# Patient Record
Sex: Female | Born: 1980 | Race: White | Hispanic: No | Marital: Single | Smoking: Never smoker
Health system: Southern US, Community
[De-identification: ages and names within clinical notes are randomized; demographics above are authoritative.]

## PROBLEM LIST (undated history)

## (undated) DIAGNOSIS — K5792 Diverticulitis of intestine, part unspecified, without perforation or abscess without bleeding: Secondary | ICD-10-CM

---

## 2013-07-25 ENCOUNTER — Emergency Department (HOSPITAL_BASED_OUTPATIENT_CLINIC_OR_DEPARTMENT_OTHER): Payer: Self-pay

## 2013-07-25 ENCOUNTER — Encounter (HOSPITAL_BASED_OUTPATIENT_CLINIC_OR_DEPARTMENT_OTHER): Payer: Self-pay | Admitting: Emergency Medicine

## 2013-07-25 ENCOUNTER — Emergency Department (HOSPITAL_BASED_OUTPATIENT_CLINIC_OR_DEPARTMENT_OTHER)
Admission: EM | Admit: 2013-07-25 | Discharge: 2013-07-25 | Disposition: A | Discharge status: Home / Self Care | Payer: Self-pay | Attending: Emergency Medicine | Admitting: Emergency Medicine

## 2013-07-25 DIAGNOSIS — R1033 Periumbilical pain: Secondary | ICD-10-CM | POA: Insufficient documentation

## 2013-07-25 DIAGNOSIS — R112 Nausea with vomiting, unspecified: Secondary | ICD-10-CM | POA: Insufficient documentation

## 2013-07-25 DIAGNOSIS — R1110 Vomiting, unspecified: Secondary | ICD-10-CM

## 2013-07-25 DIAGNOSIS — Z8719 Personal history of other diseases of the digestive system: Secondary | ICD-10-CM | POA: Insufficient documentation

## 2013-07-25 DIAGNOSIS — R109 Unspecified abdominal pain: Secondary | ICD-10-CM

## 2013-07-25 DIAGNOSIS — Z3202 Encounter for pregnancy test, result negative: Secondary | ICD-10-CM | POA: Insufficient documentation

## 2013-07-25 HISTORY — DX: Diverticulitis of intestine, part unspecified, without perforation or abscess without bleeding: K57.92

## 2013-07-25 LAB — URINALYSIS, ROUTINE W REFLEX MICROSCOPIC
BILIRUBIN URINE: NEGATIVE
Glucose, UA: NEGATIVE mg/dL
Hgb urine dipstick: NEGATIVE
Ketones, ur: NEGATIVE mg/dL
Nitrite: NEGATIVE
PROTEIN: NEGATIVE mg/dL
Specific Gravity, Urine: 1.008 (ref 1.005–1.030)
Urobilinogen, UA: 0.2 mg/dL (ref 0.0–1.0)
pH: 8 (ref 5.0–8.0)

## 2013-07-25 LAB — CBC WITH DIFFERENTIAL/PLATELET
BASOS PCT: 1 % (ref 0–1)
Basophils Absolute: 0.1 10*3/uL (ref 0.0–0.1)
Eosinophils Absolute: 0.2 10*3/uL (ref 0.0–0.7)
Eosinophils Relative: 2 % (ref 0–5)
HCT: 40.7 % (ref 36.0–46.0)
HEMOGLOBIN: 14.1 g/dL (ref 12.0–15.0)
Lymphocytes Relative: 14 % (ref 12–46)
Lymphs Abs: 1.5 10*3/uL (ref 0.7–4.0)
MCH: 27.8 pg (ref 26.0–34.0)
MCHC: 34.6 g/dL (ref 30.0–36.0)
MCV: 80.1 fL (ref 78.0–100.0)
Monocytes Absolute: 0.4 10*3/uL (ref 0.1–1.0)
Monocytes Relative: 4 % (ref 3–12)
NEUTROS PCT: 80 % — AB (ref 43–77)
Neutro Abs: 8.7 10*3/uL — ABNORMAL HIGH (ref 1.7–7.7)
Platelets: 335 10*3/uL (ref 150–400)
RBC: 5.08 MIL/uL (ref 3.87–5.11)
RDW: 14.9 % (ref 11.5–15.5)
WBC: 10.8 10*3/uL — ABNORMAL HIGH (ref 4.0–10.5)

## 2013-07-25 LAB — COMPREHENSIVE METABOLIC PANEL
ALT: 21 U/L (ref 0–35)
AST: 26 U/L (ref 0–37)
Albumin: 3.9 g/dL (ref 3.5–5.2)
Alkaline Phosphatase: 75 U/L (ref 39–117)
BILIRUBIN TOTAL: 0.2 mg/dL — AB (ref 0.3–1.2)
BUN: 7 mg/dL (ref 6–23)
CALCIUM: 9.4 mg/dL (ref 8.4–10.5)
CHLORIDE: 101 meq/L (ref 96–112)
CO2: 24 mEq/L (ref 19–32)
Creatinine, Ser: 1 mg/dL (ref 0.50–1.10)
GFR calc Af Amer: 84 mL/min — ABNORMAL LOW (ref >90)
GFR, EST NON AFRICAN AMERICAN: 73 mL/min — AB (ref >90)
Glucose, Bld: 126 mg/dL — ABNORMAL HIGH (ref 70–99)
Potassium: 4 mEq/L (ref 3.7–5.3)
SODIUM: 139 meq/L (ref 137–147)
Total Protein: 7.7 g/dL (ref 6.0–8.3)

## 2013-07-25 LAB — PREGNANCY, URINE: PREG TEST UR: NEGATIVE

## 2013-07-25 LAB — LIPASE, BLOOD: LIPASE: 21 U/L (ref 11–59)

## 2013-07-25 LAB — URINE MICROSCOPIC-ADD ON

## 2013-07-25 MED ORDER — PROMETHAZINE HCL 25 MG/ML IJ SOLN
25.0000 mg | Freq: Once | INTRAMUSCULAR | Status: AC
  Administered 2013-07-25: 25 mg via INTRAVENOUS
  Filled 2013-07-25: qty 1

## 2013-07-25 MED ORDER — ONDANSETRON HCL 4 MG/2ML IJ SOLN
4.0000 mg | Freq: Once | INTRAMUSCULAR | Status: AC
  Administered 2013-07-25: 4 mg via INTRAVENOUS
  Filled 2013-07-25: qty 2

## 2013-07-25 MED ORDER — METOCLOPRAMIDE HCL 5 MG/ML IJ SOLN
5.0000 mg | Freq: Once | INTRAMUSCULAR | Status: AC
  Administered 2013-07-25: 5 mg via INTRAVENOUS
  Filled 2013-07-25: qty 2

## 2013-07-25 MED ORDER — PROMETHAZINE HCL 25 MG RE SUPP: 25.0000 mg | Freq: Four times a day (QID) | RECTAL | Status: DC | PRN

## 2013-07-25 MED ORDER — HYDROMORPHONE HCL PF 1 MG/ML IJ SOLN
1.0000 mg | INTRAMUSCULAR | Status: AC
  Administered 2013-07-25: 1 mg via INTRAVENOUS
  Filled 2013-07-25: qty 1

## 2013-07-25 MED ORDER — IOHEXOL 300 MG/ML  SOLN
100.0000 mL | Freq: Once | INTRAMUSCULAR | Status: AC | PRN
  Administered 2013-07-25: 100 mL via INTRAVENOUS

## 2013-07-25 MED ORDER — SODIUM CHLORIDE 0.9 % IV BOLUS (SEPSIS)
1000.0000 mL | INTRAVENOUS | Status: AC
  Administered 2013-07-25: 1000 mL via INTRAVENOUS

## 2013-07-25 MED ORDER — IOHEXOL 300 MG/ML  SOLN
50.0000 mL | Freq: Once | INTRAMUSCULAR | Status: AC | PRN
  Administered 2013-07-25: 50 mL via ORAL

## 2013-07-25 MED ORDER — HYDROMORPHONE HCL PF 1 MG/ML IJ SOLN
1.0000 mg | INTRAMUSCULAR | Status: AC
  Administered 2013-07-25: 1 mg via INTRAVENOUS
  Filled 2013-07-25 (×2): qty 1

## 2013-07-25 NOTE — ED Notes (Addendum)
Patient here with acute onset of lower abdominal pain with nausea, vomiting at 0400. Patient thinks it is related to her diverticulitis, also concerned that she has missed her methadone dose this morning. Actively vomiting on arrival. Skin cool and clammy on arrival. Denies diarrhea

## 2013-07-25 NOTE — Discharge Instructions (Signed)
Abdominal Pain, Women °Abdominal (stomach, pelvic, or belly) pain can be caused by many things. It is important to tell your doctor: °· The location of the pain. °· Does it come and go or is it present all the time? °· Are there things that start the pain (eating certain foods, exercise)? °· Are there other symptoms associated with the pain (fever, nausea, vomiting, diarrhea)? °All of this is helpful to know when trying to find the cause of the pain. °CAUSES  °· Stomach: virus or bacteria infection, or ulcer. °· Intestine: appendicitis (inflamed appendix), regional ileitis (Crohn's disease), ulcerative colitis (inflamed colon), irritable bowel syndrome, diverticulitis (inflamed diverticulum of the colon), or cancer of the stomach or intestine. °· Gallbladder disease or stones in the gallbladder. °· Kidney disease, kidney stones, or infection. °· Pancreas infection or cancer. °· Fibromyalgia (pain disorder). °· Diseases of the female organs: °· Uterus: fibroid (non-cancerous) tumors or infection. °· Fallopian tubes: infection or tubal pregnancy. °· Ovary: cysts or tumors. °· Pelvic adhesions (scar tissue). °· Endometriosis (uterus lining tissue growing in the pelvis and on the pelvic organs). °· Pelvic congestion syndrome (female organs filling up with blood just before the menstrual period). °· Pain with the menstrual period. °· Pain with ovulation (producing an egg). °· Pain with an IUD (intrauterine device, birth control) in the uterus. °· Cancer of the female organs. °· Functional pain (pain not caused by a disease, may improve without treatment). °· Psychological pain. °· Depression. °DIAGNOSIS  °Your doctor will decide the seriousness of your pain by doing an examination. °· Blood tests. °· X-rays. °· Ultrasound. °· CT scan (computed tomography, special type of X-ray). °· MRI (magnetic resonance imaging). °· Cultures, for infection. °· Barium enema (dye inserted in the large intestine, to better view it with  X-rays). °· Colonoscopy (looking in intestine with a lighted tube). °· Laparoscopy (minor surgery, looking in abdomen with a lighted tube). °· Major abdominal exploratory surgery (looking in abdomen with a large incision). °TREATMENT  °The treatment will depend on the cause of the pain.  °· Many cases can be observed and treated at home. °· Over-the-counter medicines recommended by your caregiver. °· Prescription medicine. °· Antibiotics, for infection. °· Birth control pills, for painful periods or for ovulation pain. °· Hormone treatment, for endometriosis. °· Nerve blocking injections. °· Physical therapy. °· Antidepressants. °· Counseling with a psychologist or psychiatrist. °· Minor or major surgery. °HOME CARE INSTRUCTIONS  °· Do not take laxatives, unless directed by your caregiver. °· Take over-the-counter pain medicine only if ordered by your caregiver. Do not take aspirin because it can cause an upset stomach or bleeding. °· Try a clear liquid diet (broth or water) as ordered by your caregiver. Slowly move to a bland diet, as tolerated, if the pain is related to the stomach or intestine. °· Have a thermometer and take your temperature several times a day, and record it. °· Bed rest and sleep, if it helps the pain. °· Avoid sexual intercourse, if it causes pain. °· Avoid stressful situations. °· Keep your follow-up appointments and tests, as your caregiver orders. °· If the pain does not go away with medicine or surgery, you may try: °· Acupuncture. °· Relaxation exercises (yoga, meditation). °· Group therapy. °· Counseling. °SEEK MEDICAL CARE IF:  °· You notice certain foods cause stomach pain. °· Your home care treatment is not helping your pain. °· You need stronger pain medicine. °· You want your IUD removed. °· You feel faint or   lightheaded. °· You develop nausea and vomiting. °· You develop a rash. °· You are having side effects or an allergy to your medicine. °SEEK IMMEDIATE MEDICAL CARE IF:  °· Your  pain does not go away or gets worse. °· You have a fever. °· Your pain is felt only in portions of the abdomen. The right side could possibly be appendicitis. The left lower portion of the abdomen could be colitis or diverticulitis. °· You are passing blood in your stools (bright red or black tarry stools, with or without vomiting). °· You have blood in your urine. °· You develop chills, with or without a fever. °· You pass out. °MAKE SURE YOU:  °· Understand these instructions. °· Will watch your condition. °· Will get help right away if you are not doing well or get worse. °Document Released: 12/30/2006 Document Revised: 05/27/2011 Document Reviewed: 01/19/2009 °[employer]® Patient Information ©2014 [employer]. ° °

## 2013-07-25 NOTE — ED Notes (Signed)
MD at bedside. 

## 2013-07-25 NOTE — ED Provider Notes (Signed)
CSN: [id number]     Arrival date & time 07/25/13  0742 History   First MD Initiated Contact with Patient 07/25/13 [phone]     Chief Complaint  Patient presents with  . Abdominal Pain  . Emesis     (Consider location/radiation/quality/duration/timing/severity/associated sxs/prior Treatment) Patient is a 33 y.o. female presenting with abdominal pain and vomiting. The history is provided by the patient.  Abdominal Pain Pain location:  Periumbilical Pain quality: aching   Pain radiates to:  Does not radiate Pain severity:  Moderate Onset quality:  Sudden Duration:  5 hours Timing:  Constant Progression:  Unchanged Chronicity:  Recurrent Context comment:  While at rest Relieved by:  Nothing Worsened by:  Nothing tried Ineffective treatments:  None tried Associated symptoms: nausea and vomiting   Associated symptoms: no chest pain, no cough, no diarrhea, no dysuria, no fatigue, no fever, no hematuria and no shortness of breath   Emesis Associated symptoms: abdominal pain   Associated symptoms: no diarrhea and no headaches     Past Medical History  Diagnosis Date  . Diverticulitis    History reviewed. No pertinent past surgical history. No family history on file. History  Substance Use Topics  . Smoking status: Never Smoker   . Smokeless tobacco: Not on file  . Alcohol Use: Not on file   OB History   Grav Para Term Preterm Abortions TAB SAB Ect Mult Living                 Review of Systems  Constitutional: Negative for fever and fatigue.  HENT: Negative for congestion and drooling.   Eyes: Negative for pain.  Respiratory: Negative for cough and shortness of breath.   Cardiovascular: Negative for chest pain.  Gastrointestinal: Positive for nausea, vomiting and abdominal pain. Negative for diarrhea.  Genitourinary: Negative for dysuria and hematuria.  Musculoskeletal: Negative for back pain, gait problem and neck pain.  Skin: Negative for color change.  Neurological:  Negative for dizziness and headaches.  Hematological: Negative for adenopathy.  Psychiatric/Behavioral: Negative for behavioral problems.  All other systems reviewed and are negative.     Allergies  Review of patient's allergies indicates no known allergies.  Home Medications   Prior to Admission medications   Medication Sig Start Date End Date Taking? Authorizing Provider  methadone (DOLOPHINE) 10 MG tablet Take 10 mg by mouth every 8 (eight) hours.   Yes Historical Provider, MD   BP 176/107  Pulse 79  Temp(Src) 98.9 F (37.2 C)  Resp 18  SpO2 99% Physical Exam  Nursing note and vitals reviewed. Constitutional: She is oriented to person, place, and time. She appears well-developed and well-nourished.  Vomiting on exam.   HENT:  Head: Normocephalic and atraumatic.  Mouth/Throat: Oropharynx is clear and moist. No oropharyngeal exudate.  Eyes: Conjunctivae and EOM are normal. Pupils are equal, round, and reactive to light.  Neck: Normal range of motion. Neck supple.  Cardiovascular: Normal rate, regular rhythm, normal heart sounds and intact distal pulses.  Exam reveals no gallop and no friction rub.   No murmur heard. Pulmonary/Chest: Effort normal and breath sounds normal. No respiratory distress. She has no wheezes.  Abdominal: Soft. Bowel sounds are normal. There is tenderness (mild to mod infraumbilical ttp, does not seem to lateralize). There is no rebound and no guarding.  Musculoskeletal: Normal range of motion. She exhibits no edema and no tenderness.  Neurological: She is alert and oriented to person, place, and time.  Skin: Skin is warm  and dry.  Psychiatric: She has a normal mood and affect. Her behavior is normal.    ED Course  Procedures (including critical care time) Labs Review Labs Reviewed  COMPREHENSIVE METABOLIC PANEL - Abnormal; Notable for the following:    Glucose, Bld 126 (*)    Total Bilirubin 0.2 (*)    GFR calc non Af Amer 73 (*)    GFR calc  Af Amer 84 (*)    All other components within normal limits  CBC WITH DIFFERENTIAL - Abnormal; Notable for the following:    WBC 10.8 (*)    Neutrophils Relative % 80 (*)    Neutro Abs 8.7 (*)    All other components within normal limits  URINALYSIS, ROUTINE W REFLEX MICROSCOPIC - Abnormal; Notable for the following:    APPearance CLOUDY (*)    Leukocytes, UA SMALL (*)    All other components within normal limits  URINE MICROSCOPIC-ADD ON - Abnormal; Notable for the following:    Squamous Epithelial / LPF MANY (*)    Bacteria, UA FEW (*)    All other components within normal limits  LIPASE, BLOOD  PREGNANCY, URINE    Imaging Review Ct Abdomen Pelvis W Contrast  07/25/2013   CLINICAL DATA:  lower abd pain, hx of diverticulitis, nausea, vomiting  EXAM: CT ABDOMEN AND PELVIS WITH CONTRAST  TECHNIQUE: Multidetector CT imaging of the abdomen and pelvis was performed using the standard protocol following bolus administration of intravenous contrast.  CONTRAST:  50mL OMNIPAQUE IOHEXOL 300 MG/ML SOLN, 100mL OMNIPAQUE IOHEXOL 300 MG/ML SOLN  COMPARISON:  None.  FINDINGS: Visualized lung bases clear. Unremarkable visualized portions of the liver, physiologically distended gallbladder, spleen, adrenal glands, kidneys, pancreas, abdominal aorta, portal vein. Small hiatal hernia. Small bowel and colon are nondilated. Appendix nondilated, unremarkable. . Urinary bladder incompletely distended. Bilateral pelvic phleboliths. Uterus unremarkable. Bilateral ovarian follicles. No ascites. No free air. No adenopathy localized. Lumbar spine unremarkable.  IMPRESSION: 1. No acute abdominal process. 2. Small hiatal hernia.   Electronically Signed   By: Oley Balmaniel  Hassell M.D.   On: 07/25/2013 11:09     EKG Interpretation None      MDM   Final diagnoses:  Vomiting  Abdominal pain    8:01 AM 33 y.o. 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 She states that her symptoms are consistent with previous episodes of diverticulitis. She denies seeing any blood in her stool or vomit. She states that she missed her methadone doses morning. She is afebrile and hypertensive here. She is vomiting on exam. Will get symptomatic control and CT of abdomen.  12:37 PM: Pt feeling much better on exam. Still having some nausea. I recommended po challenge, the pt declined. I offered continued treatment, she would prefer to go home. Will provide phenergan for home. Pt has mildly elev wbc, labs/imaging otherwise non-contrib. Possibly viral process as the cause of her sx.  I have discussed the diagnosis/risks/treatment options with the patient and believe the pt to be eligible for discharge home to follow-up with her pcp next week. We also discussed returning to the ED immediately if new or worsening sx occur. We discussed the sx which are most concerning (e.g., worsening abd pain, inability to tolerate po, fever) that necessitate immediate return. Medications administered to the patient during their visit and any new prescriptions provided to the patient are listed below.  Medications  given during this visit Medications  HYDROmorphone (DILAUDID) injection 1 mg (1 mg Intravenous Given 07/25/13 0815)  sodium chloride 0.9 % bolus 1,000 mL (0 mLs Intravenous Stopped 07/25/13 0918)  promethazine (PHENERGAN) injection 25 mg (25 mg Intravenous Given 07/25/13 0814)  ondansetron (ZOFRAN) injection 4 mg (4 mg Intravenous Given 07/25/13 0835)  HYDROmorphone (DILAUDID) injection 1 mg (1 mg Intravenous Given 07/25/13 0928)  metoCLOPramide (REGLAN) injection 5 mg (5 mg Intravenous Given 07/25/13 0925)  HYDROmorphone (DILAUDID) injection 1 mg (1 mg Intravenous Given 07/25/13 1133)  ondansetron (ZOFRAN) injection 4 mg (4 mg Intravenous Given 07/25/13 1029)  iohexol (OMNIPAQUE) 300 MG/ML solution 50 mL (50 mLs Oral Contrast  Given 07/25/13 1059)  iohexol (OMNIPAQUE) 300 MG/ML solution 100 mL (100 mLs Intravenous Contrast Given 07/25/13 1059)  sodium chloride 0.9 % bolus 1,000 mL (1,000 mLs Intravenous New Bag/Given 07/25/13 1132)    Discharge Medication List as of 07/25/2013 12:38 PM    START taking these medications   Details  promethazine (PHENERGAN) 25 MG suppository Place 1 suppository (25 mg total) rectally every 6 (six) hours as needed for nausea or vomiting., Starting 07/25/2013, Until Discontinued, Print         Junius ArgyleForrest S Haakon Titsworth, MD 07/26/13 [phone]

## 2014-12-25 ENCOUNTER — Encounter (HOSPITAL_BASED_OUTPATIENT_CLINIC_OR_DEPARTMENT_OTHER): Payer: Self-pay | Admitting: Radiology

## 2014-12-25 ENCOUNTER — Emergency Department (HOSPITAL_BASED_OUTPATIENT_CLINIC_OR_DEPARTMENT_OTHER): Payer: Self-pay

## 2014-12-25 ENCOUNTER — Emergency Department (HOSPITAL_BASED_OUTPATIENT_CLINIC_OR_DEPARTMENT_OTHER)
Admission: EM | Admit: 2014-12-25 | Discharge: 2014-12-25 | Disposition: A | Discharge status: Home / Self Care | Payer: Self-pay | Attending: Emergency Medicine | Admitting: Emergency Medicine

## 2014-12-25 DIAGNOSIS — N12 Tubulo-interstitial nephritis, not specified as acute or chronic: Secondary | ICD-10-CM | POA: Insufficient documentation

## 2014-12-25 DIAGNOSIS — R011 Cardiac murmur, unspecified: Secondary | ICD-10-CM | POA: Insufficient documentation

## 2014-12-25 DIAGNOSIS — R000 Tachycardia, unspecified: Secondary | ICD-10-CM | POA: Insufficient documentation

## 2014-12-25 DIAGNOSIS — Z3202 Encounter for pregnancy test, result negative: Secondary | ICD-10-CM | POA: Insufficient documentation

## 2014-12-25 DIAGNOSIS — Z8719 Personal history of other diseases of the digestive system: Secondary | ICD-10-CM | POA: Insufficient documentation

## 2014-12-25 LAB — CBC WITH DIFFERENTIAL/PLATELET
BASOS ABS: 0 10*3/uL (ref 0.0–0.1)
Basophils Relative: 0 %
EOS PCT: 0 %
Eosinophils Absolute: 0 10*3/uL (ref 0.0–0.7)
HEMATOCRIT: 33.8 % — AB (ref 36.0–46.0)
Hemoglobin: 11.8 g/dL — ABNORMAL LOW (ref 12.0–15.0)
LYMPHS ABS: 1 10*3/uL (ref 0.7–4.0)
Lymphocytes Relative: 5 %
MCH: 26.5 pg (ref 26.0–34.0)
MCHC: 34.9 g/dL (ref 30.0–36.0)
MCV: 76 fL — AB (ref 78.0–100.0)
MONO ABS: 2.1 10*3/uL — AB (ref 0.1–1.0)
MONOS PCT: 11 %
NEUTROS ABS: 15.8 10*3/uL — AB (ref 1.7–7.7)
Neutrophils Relative %: 83 %
Platelets: 330 10*3/uL (ref 150–400)
RBC: 4.45 MIL/uL (ref 3.87–5.11)
RDW: 14.5 % (ref 11.5–15.5)
WBC: 19 10*3/uL — ABNORMAL HIGH (ref 4.0–10.5)

## 2014-12-25 LAB — URINALYSIS, ROUTINE W REFLEX MICROSCOPIC
Bilirubin Urine: NEGATIVE
GLUCOSE, UA: NEGATIVE mg/dL
Ketones, ur: NEGATIVE mg/dL
Nitrite: POSITIVE — AB
PROTEIN: 100 mg/dL — AB
SPECIFIC GRAVITY, URINE: 1.013 (ref 1.005–1.030)
Urobilinogen, UA: 2 mg/dL — ABNORMAL HIGH (ref 0.0–1.0)
pH: 6 (ref 5.0–8.0)

## 2014-12-25 LAB — COMPREHENSIVE METABOLIC PANEL
ALBUMIN: 2.8 g/dL — AB (ref 3.5–5.0)
ALT: 33 U/L (ref 14–54)
AST: 31 U/L (ref 15–41)
Alkaline Phosphatase: 223 U/L — ABNORMAL HIGH (ref 38–126)
Anion gap: 9 (ref 5–15)
BILIRUBIN TOTAL: 0.7 mg/dL (ref 0.3–1.2)
BUN: 13 mg/dL (ref 6–20)
CHLORIDE: 104 mmol/L (ref 101–111)
CO2: 23 mmol/L (ref 22–32)
Calcium: 8.3 mg/dL — ABNORMAL LOW (ref 8.9–10.3)
Creatinine, Ser: 1.32 mg/dL — ABNORMAL HIGH (ref 0.44–1.00)
GFR calc Af Amer: 60 mL/min — ABNORMAL LOW (ref >60)
GFR calc non Af Amer: 52 mL/min — ABNORMAL LOW (ref >60)
GLUCOSE: 118 mg/dL — AB (ref 65–99)
Potassium: 3 mmol/L — ABNORMAL LOW (ref 3.5–5.1)
Sodium: 136 mmol/L (ref 135–145)
Total Protein: 7.1 g/dL (ref 6.5–8.1)

## 2014-12-25 LAB — URINE MICROSCOPIC-ADD ON

## 2014-12-25 LAB — LIPASE, BLOOD: Lipase: 17 U/L — ABNORMAL LOW (ref 22–51)

## 2014-12-25 LAB — PREGNANCY, URINE: Preg Test, Ur: NEGATIVE

## 2014-12-25 MED ORDER — HYDROMORPHONE HCL 1 MG/ML IJ SOLN
1.0000 mg | Freq: Once | INTRAMUSCULAR | Status: AC
  Administered 2014-12-25: 1 mg via INTRAVENOUS
  Filled 2014-12-25: qty 1

## 2014-12-25 MED ORDER — METRONIDAZOLE 500 MG PO TABS: 500.0000 mg | ORAL_TABLET | Freq: Two times a day (BID) | ORAL | Status: DC

## 2014-12-25 MED ORDER — OXYCODONE-ACETAMINOPHEN 5-325 MG PO TABS: 1.0000 | ORAL_TABLET | ORAL | Status: DC | PRN

## 2014-12-25 MED ORDER — CIPROFLOXACIN HCL 500 MG PO TABS: 500.0000 mg | ORAL_TABLET | Freq: Two times a day (BID) | ORAL | Status: DC

## 2014-12-25 MED ORDER — ONDANSETRON HCL 4 MG/2ML IJ SOLN
4.0000 mg | Freq: Once | INTRAMUSCULAR | Status: AC
  Administered 2014-12-25: 4 mg via INTRAVENOUS
  Filled 2014-12-25: qty 2

## 2014-12-25 MED ORDER — PROMETHAZINE HCL 25 MG/ML IJ SOLN
25.0000 mg | Freq: Once | INTRAMUSCULAR | Status: AC
  Administered 2014-12-25: 25 mg via INTRAVENOUS
  Filled 2014-12-25: qty 1

## 2014-12-25 MED ORDER — SODIUM CHLORIDE 0.9 % IV BOLUS (SEPSIS)
1000.0000 mL | Freq: Once | INTRAVENOUS | Status: AC
  Administered 2014-12-25: 1000 mL via INTRAVENOUS

## 2014-12-25 MED ORDER — IOHEXOL 300 MG/ML  SOLN
50.0000 mL | Freq: Once | INTRAMUSCULAR | Status: AC | PRN
  Administered 2014-12-25: 50 mL via ORAL

## 2014-12-25 MED ORDER — SODIUM CHLORIDE 0.9 % IV BOLUS (SEPSIS): 1000.0000 mL | Freq: Once | INTRAVENOUS | Status: DC

## 2014-12-25 MED ORDER — IOHEXOL 300 MG/ML  SOLN
100.0000 mL | Freq: Once | INTRAMUSCULAR | Status: AC | PRN
  Administered 2014-12-25: 100 mL via INTRAVENOUS

## 2014-12-25 MED ORDER — CEFTRIAXONE SODIUM 1 G IJ SOLR
INTRAMUSCULAR | Status: AC
  Filled 2014-12-25: qty 10

## 2014-12-25 MED ORDER — DEXTROSE 5 % IV SOLN
1.0000 g | Freq: Once | INTRAVENOUS | Status: AC
  Administered 2014-12-25: 1 g via INTRAVENOUS

## 2014-12-25 MED ORDER — PROMETHAZINE HCL 25 MG PO TABS: 25.0000 mg | ORAL_TABLET | Freq: Four times a day (QID) | ORAL | Status: AC | PRN

## 2014-12-25 NOTE — ED Provider Notes (Signed)
CSN: [id number]     Arrival date & time 12/25/14  1124 History   First MD Initiated Contact with Patient 12/25/14 1159     Chief Complaint  Patient presents with  . Emesis     (Consider location/radiation/quality/duration/timing/severity/associated sxs/prior Treatment) HPI Angelica Dixon is a 34 y.o. female with a history of diverticulitis comes in for evaluation of abdominal pain and vomiting. Patient states 2 or 3 years ago, she was diagnosed with diverticulitis at [facility]. She reports 2 days ago she started experiencing similar symptoms with diffuse abdominal discomfort and associated nausea and vomiting. Emesis was nonbloody and nonbilious. She reports one episode of loose stool, also nonbloody and not overtly dark. She reports having tried heroin yesterday for her pain, but this did not help. She denies fevers, chills, chest pain, short of breath, headache or vision changes, cough, leg swelling, urinary symptoms, vaginal bleeding or discharge, pelvic pain. Rates her discomfort now as an 8/10. No other aggravating or modifying factors. Patient is requesting pain medication, however upon entering the room she is sleeping comfortably.  Past Medical History  Diagnosis Date  . Diverticulitis    No past surgical history on file. No family history on file. Social History  Substance Use Topics  . Smoking status: Never Smoker   . Smokeless tobacco: Not on file  . Alcohol Use: Not on file   OB History    No data available     Review of Systems A 10 point review of systems was completed and was negative except for pertinent positives and negatives as mentioned in the history of present illness     Allergies  Review of patient's allergies indicates no known allergies.  Home Medications   Prior to Admission medications   Medication Sig Start Date End Date Taking? Authorizing Provider  methadone (DOLOPHINE) 10 MG tablet Take 10 mg by mouth every 8 (eight) hours.   Yes  Historical Provider, MD  promethazine (PHENERGAN) 25 MG suppository Place 1 suppository (25 mg total) rectally every 6 (six) hours as needed for nausea or vomiting. 07/25/13   Purvis Sheffield, MD   BP 168/102 mmHg  Pulse 123  Temp(Src) 99.5 F (37.5 C) (Oral)  Resp 20  Ht  (1.803 m)  Wt 180 lb (81.647 kg)  BMI 25.12 kg/m2  SpO2 100%  LMP 12/11/2014 Physical Exam  Constitutional: She is oriented to person, place, and time. She appears well-developed and well-nourished.  HENT:  Head: Normocephalic and atraumatic.  Mouth/Throat: Oropharynx is clear and moist.  Eyes: Conjunctivae are normal. Pupils are equal, round, and reactive to light. Right eye exhibits no discharge. Left eye exhibits no discharge. No scleral icterus.  Neck: Neck supple.  Cardiovascular: Normal rate and regular rhythm.   Tachycardia with S1 murmur.  Pulmonary/Chest: Effort normal and breath sounds normal. No respiratory distress. She has no wheezes. She has no rales.  Abdominal: Soft.  Diffuse abdominal tenderness with palpation. No focal tenderness. Abdomen is soft and nondistended without peritoneal signs.  Musculoskeletal: She exhibits no tenderness.  Neurological: She is alert and oriented to person, place, and time.  Cranial Nerves II-XII grossly intact  Skin: Skin is warm and dry. No rash noted.  Psychiatric: She has a normal mood and affect.  Nursing note and vitals reviewed.   ED Course  Procedures (including critical care time) Labs Review Labs Reviewed  COMPREHENSIVE METABOLIC PANEL  CBC WITH DIFFERENTIAL/PLATELET  LIPASE, BLOOD  URINALYSIS, ROUTINE W REFLEX MICROSCOPIC (NOT AT [facility])  PREGNANCY,  URINE    Imaging Review No results found. I have personally reviewed and evaluated these images and lab results as part of my medical decision-making.   EKG Interpretation None     Meds given in ED:  Medications  ondansetron (ZOFRAN) injection 4 mg (4 mg Intravenous Given 12/25/14 1241)   sodium chloride 0.9 % bolus 1,000 mL (0 mLs Intravenous Stopped 12/25/14 1432)  sodium chloride 0.9 % bolus 1,000 mL (1,000 mLs Intravenous New Bag/Given 12/25/14 1447)  cefTRIAXone (ROCEPHIN) 1 g in dextrose 5 % 50 mL IVPB (0 g Intravenous Stopped 12/25/14 1348)  cefTRIAXone (ROCEPHIN) 1 G injection (  Duplicate 12/25/14 1258)  iohexol (OMNIPAQUE) 300 MG/ML solution 50 mL (50 mLs Oral Contrast Given 12/25/14 1404)  iohexol (OMNIPAQUE) 300 MG/ML solution 100 mL (100 mLs Intravenous Contrast Given 12/25/14 1403)  HYDROmorphone (DILAUDID) injection 1 mg (1 mg Intravenous Given 12/25/14 1354)  promethazine (PHENERGAN) injection 25 mg (25 mg Intravenous Given 12/25/14 1350)    New Prescriptions   No medications on file   Filed Vitals:   12/25/14 1319 12/25/14 1330 12/25/14 1435 12/25/14 1436  BP: 167/105 157/97 165/96   Pulse: 110 104  101  Temp: 100 F (37.8 C)     TempSrc: Oral     Resp: 22 15    Height:      Weight:      SpO2: 96% 100%  100%    MDM  Vitals stable  -afebrile Patient was initially tachycardic on arrival at 123, responded well after 1 L of normal saline and heart rate now 101 Pt resting comfortably in ED. PE--physical exam as above. Abdomen is soft and there is no evidence of surgical abdomen. Labwork-leukocytosis 19.0, neutrophils 15.8, CMP shows creatinine of 1.32, elevated alkaline phosphatase of 223, evidence of UTI-nitrite positive with large leukocytes and many bacteria Imaging-due to polysubstance abuse history and concern for possible perinephric abscess obtained CT abdomen pelvis. CT abdomen shows areas of low densities bilaterally in both kidneys and runny perinephric fluid concerning for bilateral pyelonephritis. No hydronephrosis or renal obstruction, no calculi.  DDX--patient with abdominal pain, found to have bilateral pyelonephritis. Treated in the ED with Rocephin. Urine culture obtained. Creatinine is 1.3. She does have an elevated alkaline phosphatase,  however there is no right upper quadrant pain. Vital signs have steadily improved with IV fluids. We'll discharge with Cipro, antinausea medicines and short course pain medicines. Discussed with patient and she reports that she does not have a pain contract and will be able to take outpatient medicines.  I discussed all relevant lab findings and imaging results with pt and they verbalized understanding. Discussed f/u with PCP within 48 hrs and return precautions, pt very amenable to plan. Prior to patient discharge, I discussed and reviewed this case with Dr.Knott   Upon discharge, it was noted patient's urine was positive for Trichomonas. Attempted to call patient, left voicemail for patient to return to ED and obtain prescription for metronidazole.  Final diagnoses:  Pyelonephritis        Joycie Peek, PA-C 12/25/14 2355  Lyndal Pulley, MD 12/26/14 [phone]

## 2014-12-25 NOTE — ED Notes (Addendum)
Pt stated to [facility], PA-C that she used heroine yesterday.

## 2014-12-25 NOTE — Discharge Instructions (Signed)
You were evaluated in the ED today for your abdominal and back pain. Your found to have pyelonephritis. This is an infection of your kidneys. You'll be treated for this problem with antibiotics. Please take all of your medicines as prescribed. Do not share them. Take your pain medicines as prescribed for severe pain. Follow-up with your doctor in 1 week for reevaluation. Return to ED for worsening symptoms including fevers, worsening pain, nausea and vomiting.  Pyelonephritis, Adult Pyelonephritis is a kidney infection. The kidneys are the organs that filter a person's blood and move waste out of the bloodstream and into the urine. Urine passes from the kidneys, through the ureters, and into the bladder. There are two main types of pyelonephritis:  Infections that come on quickly without any warning (acute pyelonephritis).  Infections that last for a long period of time (chronic pyelonephritis). In most cases, the infection clears up with treatment and does not cause further problems. More severe infections or chronic infections can sometimes spread to the bloodstream or lead to other problems with the kidneys. CAUSES This condition is usually caused by:  Bacteria traveling from the bladder to the kidney through infected urine. The urine in the bladder can become infected with bacteria from:  Bladder infection (cystitis).  Inflammation of the prostate gland (prostatitis).  Sexual intercourse, in females.  Bacteria traveling from the bloodstream to the kidney. RISK FACTORS This condition is more likely to develop in:  Pregnant women.  Older people.  People who have diabetes.  People who have kidney stones or bladder stones.  People who have other abnormalities of the kidney or ureter.  People who have a catheter placed in the bladder.  People who have cancer.  People who are sexually active.  Women who use spermicides.  People who have had a prior urinary tract  infection. SYMPTOMS Symptoms of this condition include:  Frequent urination.  Strong or persistent urge to urinate.  Burning or stinging when urinating.  Abdominal pain.  Back pain.  Pain in the side or flank area.  Fever.  Chills.  Blood in the urine, or dark urine.  Nausea.  Vomiting. DIAGNOSIS This condition may be diagnosed based on:  Medical history and physical exam.  Urine tests.  Blood tests. You may also have imaging tests of the kidneys, such as an ultrasound or CT scan. TREATMENT Treatment for this condition may depend on the severity of the infection.  If the infection is mild and is found early, you may be treated with antibiotic medicines taken by mouth. You will need to drink fluids to remain hydrated.  If the infection is more severe, you may need to stay in the hospital and receive antibiotics given directly into a vein through an IV tube. You may also need to receive fluids through an IV tube if you are not able to remain hydrated. After your hospital stay, you may need to take oral antibiotics for a period of time. Other treatments may be required, depending on the cause of the infection. HOME CARE INSTRUCTIONS Medicines  Take over-the-counter and prescription medicines only as told by your health care provider.  If you were prescribed an antibiotic medicine, take it as told by your health care provider. Do not stop taking the antibiotic even if you start to feel better. General Instructions  Drink enough fluid to keep your urine clear or pale yellow.  Avoid caffeine, tea, and carbonated beverages. They tend to irritate the bladder.  Urinate often. Avoid holding in  urine for long periods of time.  Urinate before and after sex.  After a bowel movement, women should cleanse from front to back. Use each tissue only once.  Keep all follow-up visits as told by your health care provider. This is important. SEEK MEDICAL CARE IF:  Your symptoms  do not get better after 2 days of treatment.  Your symptoms get worse.  You have a fever. SEEK IMMEDIATE MEDICAL CARE IF:  You are unable to take your antibiotics or fluids.  You have shaking chills.  You vomit.  You have severe flank or back pain.  You have extreme weakness or fainting.   This information is not intended to replace advice given to you by your health care provider. Make sure you discuss any questions you have with your health care provider.   Document Released: 03/04/2005 Document Revised: 11/23/2014 Document Reviewed: 06/27/2014 Elsevier Interactive Patient Education [employer]

## 2014-12-27 LAB — URINE CULTURE

## 2014-12-28 ENCOUNTER — Telehealth (HOSPITAL_BASED_OUTPATIENT_CLINIC_OR_DEPARTMENT_OTHER): Payer: Self-pay | Admitting: Emergency Medicine

## 2014-12-28 NOTE — Telephone Encounter (Signed)
Post ED Visit - Positive Culture Follow-up  Culture report reviewed by antimicrobial stewardship pharmacist:  []  Angelica MiyamotoJeremy Dixon, Pharm.D., BCPS []  [name], [address] BCPS []  Kelleys IslandMinh Dixon, [address]Ph[address]D., BCPS, AAHIVP []  Estella HuskMichelle Dixon, Pharm.D., BCPS, AAHIVP []  Angelica Dixon, [address] []  [name], [place]arm[place]. Lisette GrinderAlyson Dixon PharmD  Positive urine culture E. Coli Treated with ciprofloxacin, organism sensitive to the same and no further patient follow-up is required at this time.  Berle MullMiller, Angelica Dixon 12/28/2014, 10:37 AM

## 2015-03-13 ENCOUNTER — Encounter (HOSPITAL_BASED_OUTPATIENT_CLINIC_OR_DEPARTMENT_OTHER): Payer: Self-pay | Admitting: *Deleted

## 2015-03-13 ENCOUNTER — Emergency Department (HOSPITAL_BASED_OUTPATIENT_CLINIC_OR_DEPARTMENT_OTHER): Payer: Self-pay

## 2015-03-13 ENCOUNTER — Emergency Department (HOSPITAL_BASED_OUTPATIENT_CLINIC_OR_DEPARTMENT_OTHER)
Admission: EM | Admit: 2015-03-13 | Discharge: 2015-03-13 | Disposition: A | Discharge status: Home / Self Care | Payer: Self-pay | Attending: Emergency Medicine | Admitting: Emergency Medicine

## 2015-03-13 DIAGNOSIS — R197 Diarrhea, unspecified: Secondary | ICD-10-CM | POA: Insufficient documentation

## 2015-03-13 DIAGNOSIS — R1011 Right upper quadrant pain: Secondary | ICD-10-CM | POA: Insufficient documentation

## 2015-03-13 DIAGNOSIS — R1013 Epigastric pain: Secondary | ICD-10-CM | POA: Insufficient documentation

## 2015-03-13 DIAGNOSIS — R1010 Upper abdominal pain, unspecified: Secondary | ICD-10-CM

## 2015-03-13 DIAGNOSIS — R1030 Lower abdominal pain, unspecified: Secondary | ICD-10-CM | POA: Insufficient documentation

## 2015-03-13 DIAGNOSIS — R112 Nausea with vomiting, unspecified: Secondary | ICD-10-CM | POA: Insufficient documentation

## 2015-03-13 DIAGNOSIS — Z8719 Personal history of other diseases of the digestive system: Secondary | ICD-10-CM | POA: Insufficient documentation

## 2015-03-13 DIAGNOSIS — Z3202 Encounter for pregnancy test, result negative: Secondary | ICD-10-CM | POA: Insufficient documentation

## 2015-03-13 DIAGNOSIS — R1012 Left upper quadrant pain: Secondary | ICD-10-CM | POA: Insufficient documentation

## 2015-03-13 LAB — COMPREHENSIVE METABOLIC PANEL
ALBUMIN: 4.2 g/dL (ref 3.5–5.0)
ALK PHOS: 62 U/L (ref 38–126)
ALT: 13 U/L — AB (ref 14–54)
ANION GAP: 5 (ref 5–15)
AST: 21 U/L (ref 15–41)
BUN: 12 mg/dL (ref 6–20)
CALCIUM: 8.8 mg/dL — AB (ref 8.9–10.3)
CO2: 26 mmol/L (ref 22–32)
CREATININE: 1.03 mg/dL — AB (ref 0.44–1.00)
Chloride: 109 mmol/L (ref 101–111)
GFR calc Af Amer: >60 mL/min (ref >60)
GFR calc non Af Amer: >60 mL/min (ref >60)
GLUCOSE: 89 mg/dL (ref 65–99)
Potassium: 3.6 mmol/L (ref 3.5–5.1)
SODIUM: 140 mmol/L (ref 135–145)
Total Bilirubin: 1 mg/dL (ref 0.3–1.2)
Total Protein: 7.6 g/dL (ref 6.5–8.1)

## 2015-03-13 LAB — URINALYSIS, ROUTINE W REFLEX MICROSCOPIC
BILIRUBIN URINE: NEGATIVE
GLUCOSE, UA: NEGATIVE mg/dL
HGB URINE DIPSTICK: NEGATIVE
KETONES UR: NEGATIVE mg/dL
Nitrite: NEGATIVE
PH: 6.5 (ref 5.0–8.0)
Protein, ur: NEGATIVE mg/dL
Specific Gravity, Urine: 1.007 (ref 1.005–1.030)

## 2015-03-13 LAB — URINE MICROSCOPIC-ADD ON: RBC / HPF: NONE SEEN RBC/hpf (ref 0–5)

## 2015-03-13 LAB — CBC
HCT: 39.6 % (ref 36.0–46.0)
HEMOGLOBIN: 13.5 g/dL (ref 12.0–15.0)
MCH: 27.4 pg (ref 26.0–34.0)
MCHC: 34.1 g/dL (ref 30.0–36.0)
MCV: 80.5 fL (ref 78.0–100.0)
Platelets: 273 10*3/uL (ref 150–400)
RBC: 4.92 MIL/uL (ref 3.87–5.11)
RDW: 15.2 % (ref 11.5–15.5)
WBC: 12.7 10*3/uL — ABNORMAL HIGH (ref 4.0–10.5)

## 2015-03-13 LAB — LIPASE, BLOOD: Lipase: 23 U/L (ref 11–51)

## 2015-03-13 LAB — PREGNANCY, URINE: Preg Test, Ur: NEGATIVE

## 2015-03-13 MED ORDER — LANSOPRAZOLE 30 MG PO CPDR
30.0000 mg | DELAYED_RELEASE_CAPSULE | Freq: Every day | ORAL | Status: AC
Start: 2015-03-13 — End: ?

## 2015-03-13 MED ORDER — SODIUM CHLORIDE 0.9 % IV BOLUS (SEPSIS)
1000.0000 mL | Freq: Once | INTRAVENOUS | Status: AC
  Administered 2015-03-13: 1000 mL via INTRAVENOUS

## 2015-03-13 MED ORDER — ONDANSETRON 4 MG PO TBDP: ORAL_TABLET | ORAL | Status: DC

## 2015-03-13 MED ORDER — MORPHINE SULFATE (PF) 4 MG/ML IV SOLN
4.0000 mg | Freq: Once | INTRAVENOUS | Status: AC
  Administered 2015-03-13: 4 mg via INTRAVENOUS
  Filled 2015-03-13: qty 1

## 2015-03-13 MED ORDER — IOHEXOL 300 MG/ML  SOLN
50.0000 mL | Freq: Once | INTRAMUSCULAR | Status: AC | PRN
  Administered 2015-03-13: 50 mL via ORAL

## 2015-03-13 MED ORDER — IOHEXOL 300 MG/ML  SOLN
100.0000 mL | Freq: Once | INTRAMUSCULAR | Status: AC | PRN
  Administered 2015-03-13: 100 mL via INTRAVENOUS

## 2015-03-13 MED ORDER — ONDANSETRON HCL 4 MG/2ML IJ SOLN
4.0000 mg | Freq: Once | INTRAMUSCULAR | Status: AC
  Administered 2015-03-13: 4 mg via INTRAVENOUS
  Filled 2015-03-13: qty 2

## 2015-03-13 MED ORDER — DICYCLOMINE HCL 20 MG PO TABS: 20.0000 mg | ORAL_TABLET | Freq: Two times a day (BID) | ORAL | Status: AC | PRN

## 2015-03-13 NOTE — ED Notes (Signed)
vss 150/110 p 73 rr 16 spo2 98% ra cbg 80

## 2015-03-13 NOTE — ED Notes (Signed)
brought in by emsPt c/o diffuse abd  pain with n/v x 2 days

## 2015-03-13 NOTE — ED Provider Notes (Signed)
CSN: [id number]     Arrival date & time 03/13/15  1647 History  By signing my name below, I, Bethel Born, attest that this documentation has been prepared under the direction and in the presence of Loren Racer, MD. Electronically Signed: Bethel Born, ED Scribe. 03/13/2015. 8:35 PM     Chief Complaint  Patient presents with  . Abdominal Pain    The history is provided by the patient. No language interpreter was used.   Yesmin Mutch is a 34 y.o. female with history of diverticulitis who presents to the Emergency Department complaining of constant, 10/10 in severity, burning, mid abdominal pain with onset yesterday. The pain occasionally radiates to the back. Pt notes that the pain is improved with warm baths.  She has been eating and drinking normally without increased pain. This pain is similar to pain that she has had in the past related to diverticulitis. She took nothing for pain at home. Associated symptoms include nausea, 3 episodes of non-bloody emesis, and 1 loose stool. Pt denies hematochezia or dark colored stool, dysuria, change in urinary frequency, and abnormal vaginal discharge . LNMP was at the beginning of the month and she menstruates regularly.   Past Medical History  Diagnosis Date  . Diverticulitis    History reviewed. No pertinent past surgical history. History reviewed. No pertinent family history. Social History  Substance Use Topics  . Smoking status: Never Smoker   . Smokeless tobacco: None  . Alcohol Use: No   OB History    No data available     Review of Systems  Constitutional: Negative for fever and chills.  Respiratory: Negative for shortness of breath.   Cardiovascular: Negative for chest pain.  Gastrointestinal: Positive for nausea, vomiting, abdominal pain and diarrhea. Negative for constipation and blood in stool.  Genitourinary: Negative for dysuria, hematuria, flank pain, vaginal bleeding, vaginal discharge, difficulty urinating  and pelvic pain.  Musculoskeletal: Negative for back pain, neck pain and neck stiffness.  Skin: Negative for pallor and rash.  Neurological: Negative for dizziness, weakness, light-headedness, numbness and headaches.  All other systems reviewed and are negative.     Allergies  Review of patient's allergies indicates no known allergies.  Home Medications   Prior to Admission medications   Medication Sig Start Date End Date Taking? Authorizing Provider  dicyclomine (BENTYL) 20 MG tablet Take 1 tablet (20 mg total) by mouth 2 (two) times daily as needed for spasms. 03/13/15   Loren Racer, MD  lansoprazole (PREVACID) 30 MG capsule Take 1 capsule (30 mg total) by mouth daily at 12 noon. 03/13/15   Loren Racer, MD  ondansetron (ZOFRAN ODT) 4 MG disintegrating tablet  ODT q4 hours prn nausea/vomit 03/13/15   Loren Racer, MD  promethazine (PHENERGAN) 25 MG tablet Take 1 tablet (25 mg total) by mouth every 6 (six) hours as needed for nausea or vomiting. 12/25/14   Benjamin Cartner, PA-C   BP 136/90 mmHg  Pulse 74  Temp(Src) 98.2 F (36.8 C)  Resp 18  Wt 190 lb (86.183 kg)  SpO2 100%  LMP 02/16/2015 Physical Exam  Constitutional: She is oriented to person, place, and time. She appears well-developed and well-nourished. No distress.  HENT:  Head: Normocephalic and atraumatic.  Mouth/Throat: Oropharynx is clear and moist.  Eyes: EOM are normal. Pupils are equal, round, and reactive to light.  Neck: Normal range of motion. Neck supple.  Cardiovascular: Normal rate and regular rhythm.   Pulmonary/Chest: Effort normal and breath sounds normal. No respiratory  distress. She has no wheezes. She has no rales. She exhibits no tenderness.  Abdominal: Soft. Bowel sounds are normal. She exhibits no distension. There is tenderness (epigastric, right and left upper quadrant tenderness. Very mild lower abdominal tenderness). There is no rebound and no guarding.  Musculoskeletal: Normal  range of motion. She exhibits no edema or tenderness.  No CVA tenderness bilaterally  Neurological: She is alert and oriented to person, place, and time.  Moves all extremities without deficit. Sensation is fully intact.  Skin: Skin is warm and dry. No rash noted. No erythema.  Psychiatric: She has a normal mood and affect. Her behavior is normal.  Nursing note and vitals reviewed.   ED Course  Procedures (including critical care time) DIAGNOSTIC STUDIES: Oxygen Saturation is 100% on RA,  normal by my interpretation.    COORDINATION OF CARE: 8:33 PM Discussed treatment plan which includes lab work, CT A/P,  and pain management with pt at bedside and pt agreed to plan.  Labs Review Labs Reviewed  URINALYSIS, ROUTINE W REFLEX MICROSCOPIC (NOT AT [facility]) - Abnormal; Notable for the following:    Leukocytes, UA TRACE (*)    All other components within normal limits  URINE MICROSCOPIC-ADD ON - Abnormal; Notable for the following:    Squamous Epithelial / LPF 0-5 (*)    Bacteria, UA RARE (*)    All other components within normal limits  COMPREHENSIVE METABOLIC PANEL - Abnormal; Notable for the following:    Creatinine, Ser 1.03 (*)    Calcium 8.8 (*)    ALT 13 (*)    All other components within normal limits  CBC - Abnormal; Notable for the following:    WBC 12.7 (*)    All other components within normal limits  PREGNANCY, URINE  LIPASE, BLOOD    Imaging Review Ct Abdomen Pelvis W Contrast  03/13/2015  CLINICAL DATA:  Acute onset of generalized abdominal pain. Nausea and vomiting. Initial encounter. EXAM: CT ABDOMEN AND PELVIS WITH CONTRAST TECHNIQUE: Multidetector CT imaging of the abdomen and pelvis was performed using the standard protocol following bolus administration of intravenous contrast. CONTRAST:  <[phone] Health CarePhysicians Ca639-600James A Haley AdvaMarland Kitchen40M<[phone] SurRadiance A Private Outpatien331-245[facility] AdvaMarland Kitchen4<[phone] HungeComm22509ClarksburgAdvaMarland Kitchen40Ma<MEASURE650 428 2501[facility] SurgSuga601 446Healthsouth RehabilitatiAdvaMarland Kitchen40Ma<[phone] CentVib[facility] [phone] CountyAdvaMarland Kitchen4<[phone] Endos819-AdvaMarland Kitchen40M<[id number]n AdvaMarland Kitchen40M<MEASUREMEKe([phone] CherryVirginia Ce630-728Lake TaAdvaMarland Kitchen40Ma<[phone] HealthBon Secours Rappahann928-813SchuylkilAdvaMarland [phone] Palmer HospitaBlue Mountain [phone] PaciAdvaMarland Kitchen40M<MEASUREMEKe9564627Devereux Ho667-021-20[place] HSalina Re26080[facility] CeAdvaMarland Kitchen40M<[id number] BarHudson B85865HammAdvaMarland Kitchen40M<[phone] [phone] AmbulatoAdvaMarland Kitchen40Ma(<[phone] CZaze867-218[facility] HospiAdvaMarland Kitchen40M<[phone] SurgContinuous [phone]-St FranAdvaMarland Kitchen40M<[id number] FouEncompass Health Rehabilitation Hos214-236Oscar G. JohnsonAdvaMarland [phone] CAdvaMarland [phone] Valley Universit939-424[facility] TapAdvaMarland Kitchen40Ma<MEASUREMEKe([phone] GeSaint Joseph'S Regional Medic615-525[facility] AdvaMarland [phone]. Joseph Jennersvil717-275[facility] AdvaMarland Kitchen40Ma(<[phone] RehMarykay LexAbbecesneferson HealthcareCOMPARISON:  CT of the abdomen and pelvis from 12/25/2014 FINDINGS: The visualized lung bases are clear. The liver and spleen are  unremarkable in appearance. The gallbladder is within normal limits. The pancreas and adrenal glands are unremarkable. The kidneys are unremarkable in appearance. There is no evidence of hydronephrosis. No renal or ureteral stones are seen. No perinephric stranding is appreciated. No free fluid is identified. The small bowel is unremarkable in appearance. The stomach is within normal limits. No acute vascular abnormalities are seen. The appendix is normal in caliber and contains air, without evidence of appendicitis. The colon is unremarkable in appearance. The bladder is mildly distended and grossly unremarkable. The uterus is unremarkable in appearance. The ovaries are relatively symmetric. No suspicious adnexal masses are seen. No inguinal lymphadenopathy is seen. No acute osseous abnormalities are identified. IMPRESSION: No acute abnormality seen within the abdomen or pelvis. Electronically Signed   By: Jeffery  Chang M.D.   On: 03/13/2015 22:28   I have personally reviewed and evaluated these images and lab results as part of my medical decision-making.   EKG Interpretation None      MDM   Final diagnoses:  Pain of upper abdomen  Non-intractable vomiting with nausea, vomiting of unspecified type    I personally performed the services described in this documentation, which was  scribed in my presence. The recorded information has been reviewed and is accurate.   Patient with history of chronic abdominal pain and narcotic abuse. Abdominal exam is benign CT no evidence of diverticulitis. No active vomiting in the emergency department. Possibly gastritis versus narcotic withdrawal. We'll treat symptomatically.   Loren Racer, MD 03/13/15 2251

## 2015-03-13 NOTE — ED Notes (Signed)
Abdominal pain, states it is diverticulitis pain, started yesterday am, no prescribed medications for it, describes as constant burning pain, incr with movement and laying flat, pain decr. When sitting up or in a warm tub, denies n/v/d, vss

## 2015-03-13 NOTE — ED Notes (Signed)
Patient asking for more pain medications.

## 2015-03-13 NOTE — Discharge Instructions (Signed)
Abdominal Pain, Adult °Many things can cause abdominal pain. Usually, abdominal pain is not caused by a disease and will improve without treatment. It can often be observed and treated at home. Your health care provider will do a physical exam and possibly order blood tests and X-rays to help determine the seriousness of your pain. However, in many cases, more time must pass before a clear cause of the pain can be found. Before that point, your health care provider may not know if you need more testing or further treatment. °HOME CARE INSTRUCTIONS °Monitor your abdominal pain for any changes. The following actions may help to alleviate any discomfort you are experiencing: °· Only take over-the-counter or prescription medicines as directed by your health care provider. °· Do not take laxatives unless directed to do so by your health care provider. °· Try a clear liquid diet (broth, tea, or water) as directed by your health care provider. Slowly move to a bland diet as tolerated. °SEEK MEDICAL CARE IF: °· You have unexplained abdominal pain. °· You have abdominal pain associated with nausea or diarrhea. °· You have pain when you urinate or have a bowel movement. °· You experience abdominal pain that wakes you in the night. °· You have abdominal pain that is worsened or improved by eating food. °· You have abdominal pain that is worsened with eating fatty foods. °· You have a fever. °SEEK IMMEDIATE MEDICAL CARE IF: °· Your pain does not go away within 2 hours. °· You keep throwing up (vomiting). °· Your pain is felt only in portions of the abdomen, such as the right side or the left lower portion of the abdomen. °· You pass bloody or black tarry stools. °MAKE SURE YOU: °· Understand these instructions. °· Will watch your condition. °· Will get help right away if you are not doing well or get worse. °  °This information is not intended to replace advice given to you by your health care provider. Make sure you discuss  any questions you have with your health care provider. °  °Document Released: 12/12/2004 Document Revised: 11/23/2014 Document Reviewed: 11/11/2012 °Elsevier Interactive Patient Education ©2016 [employer] ° °Nausea and Vomiting °Nausea is a sick feeling that often comes before throwing up (vomiting). Vomiting is a reflex where stomach contents come out of your mouth. Vomiting can cause severe loss of body fluids (dehydration). Children and elderly adults can become dehydrated quickly, especially if they also have diarrhea. Nausea and vomiting are symptoms of a condition or disease. It is important to find the cause of your symptoms. °CAUSES  °· Direct irritation of the stomach lining. This irritation can result from increased acid production (gastroesophageal reflux disease), infection, food poisoning, taking certain medicines (such as nonsteroidal anti-inflammatory drugs), alcohol use, or tobacco use. °· Signals from the brain. These signals could be caused by a headache, heat exposure, an inner ear disturbance, increased pressure in the brain from injury, infection, a tumor, or a concussion, pain, emotional stimulus, or metabolic problems. °· An obstruction in the gastrointestinal tract (bowel obstruction). °· Illnesses such as diabetes, hepatitis, gallbladder problems, appendicitis, kidney problems, cancer, sepsis, atypical symptoms of a heart attack, or eating disorders. °· Medical treatments such as chemotherapy and radiation. °· Receiving medicine that makes you sleep (general anesthetic) during surgery. °DIAGNOSIS °Your caregiver may ask for tests to be done if the problems do not improve after a few days. Tests may also be done if symptoms are severe or if the reason for the   nausea and vomiting is not clear. Tests may include: °· Urine tests. °· Blood tests. °· Stool tests. °· Cultures (to look for evidence of infection). °· X-rays or other imaging studies. °Test results can help your caregiver make  decisions about treatment or the need for additional tests. °TREATMENT °You need to stay well hydrated. Drink frequently but in small amounts. You may wish to drink water, sports drinks, clear broth, or eat frozen ice pops or gelatin dessert to help stay hydrated. When you eat, eating slowly may help prevent nausea. There are also some antinausea medicines that may help prevent nausea. °HOME CARE INSTRUCTIONS  °· Take all medicine as directed by your caregiver. °· If you do not have an appetite, do not force yourself to eat. However, you must continue to drink fluids. °· If you have an appetite, eat a normal diet unless your caregiver tells you differently. °¨ Eat a variety of complex carbohydrates (rice, wheat, potatoes, bread), lean meats, yogurt, fruits, and vegetables. °¨ Avoid high-fat foods because they are more difficult to digest. °· Drink enough water and fluids to keep your urine clear or pale yellow. °· If you are dehydrated, ask your caregiver for specific rehydration instructions. Signs of dehydration may include: °¨ Severe thirst. °¨ Dry lips and mouth. °¨ Dizziness. °¨ Dark urine. °¨ Decreasing urine frequency and amount. °¨ Confusion. °¨ Rapid breathing or pulse. °SEEK IMMEDIATE MEDICAL CARE IF:  °· You have blood or brown flecks (like coffee grounds) in your vomit. °· You have black or bloody stools. °· You have a severe headache or stiff neck. °· You are confused. °· You have severe abdominal pain. °· You have chest pain or trouble breathing. °· You do not urinate at least once every 8 hours. °· You develop cold or clammy skin. °· You continue to vomit for longer than 24 to 48 hours. °· You have a fever. °MAKE SURE YOU:  °· Understand these instructions. °· Will watch your condition. °· Will get help right away if you are not doing well or get worse. °  °This information is not intended to replace advice given to you by your health care provider. Make sure you discuss any questions you have with  your health care provider. °  °Document Released: 03/04/2005 Document Revised: 05/27/2011 Document Reviewed: 08/01/2010 °Elsevier Interactive Patient Education ©2016 [employer] Inc. ° °

## 2015-10-03 ENCOUNTER — Emergency Department (HOSPITAL_BASED_OUTPATIENT_CLINIC_OR_DEPARTMENT_OTHER): Payer: Medicaid Other

## 2015-10-03 ENCOUNTER — Encounter (HOSPITAL_BASED_OUTPATIENT_CLINIC_OR_DEPARTMENT_OTHER): Payer: Self-pay | Admitting: *Deleted

## 2015-10-03 ENCOUNTER — Emergency Department (HOSPITAL_BASED_OUTPATIENT_CLINIC_OR_DEPARTMENT_OTHER)
Admission: EM | Admit: 2015-10-03 | Discharge: 2015-10-03 | Disposition: A | Discharge status: Home / Self Care | Payer: Medicaid Other | Attending: Emergency Medicine | Admitting: Emergency Medicine

## 2015-10-03 DIAGNOSIS — M542 Cervicalgia: Secondary | ICD-10-CM

## 2015-10-03 DIAGNOSIS — Y92410 Unspecified street and highway as the place of occurrence of the external cause: Secondary | ICD-10-CM | POA: Diagnosis not present

## 2015-10-03 DIAGNOSIS — M62838 Other muscle spasm: Secondary | ICD-10-CM | POA: Insufficient documentation

## 2015-10-03 DIAGNOSIS — Z79899 Other long term (current) drug therapy: Secondary | ICD-10-CM | POA: Diagnosis not present

## 2015-10-03 DIAGNOSIS — Y999 Unspecified external cause status: Secondary | ICD-10-CM | POA: Insufficient documentation

## 2015-10-03 DIAGNOSIS — Y9389 Activity, other specified: Secondary | ICD-10-CM | POA: Insufficient documentation

## 2015-10-03 MED ORDER — DIAZEPAM 5 MG PO TABS: 5.0000 mg | ORAL_TABLET | Freq: Four times a day (QID) | ORAL | Status: AC | PRN

## 2015-10-03 MED ORDER — DIAZEPAM 5 MG PO TABS
5.0000 mg | ORAL_TABLET | Freq: Once | ORAL | Status: AC
Start: 2015-10-03 — End: 2015-10-03
  Administered 2015-10-03: 5 mg via ORAL
  Filled 2015-10-03: qty 1

## 2015-10-03 MED ORDER — IBUPROFEN 800 MG PO TABS
800.0000 mg | ORAL_TABLET | Freq: Once | ORAL | Status: AC
Start: 2015-10-03 — End: 2015-10-03
  Administered 2015-10-03: 800 mg via ORAL
  Filled 2015-10-03: qty 1

## 2015-10-03 MED ORDER — IBUPROFEN 800 MG PO TABS: 800.0000 mg | ORAL_TABLET | Freq: Three times a day (TID) | ORAL | Status: AC | PRN

## 2015-10-03 MED FILL — diazePAM 5 MG TABS: 5 | 2 days supply | Qty: 10 | Fill #0

## 2015-10-03 MED FILL — IBUPROFEN 800 MG TABLET: 800 | 10 days supply | Qty: 30 | Fill #0
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
